# Patient Record
Sex: Male | Born: 1960 | Race: White | Hispanic: No | Marital: Married | Smoking: Never smoker
Health system: Southern US, Community
[De-identification: ages and names within clinical notes are randomized; demographics above are authoritative.]

## PROBLEM LIST (undated history)

## (undated) DIAGNOSIS — M419 Scoliosis, unspecified: Secondary | ICD-10-CM

## (undated) DIAGNOSIS — M549 Dorsalgia, unspecified: Secondary | ICD-10-CM

## (undated) DIAGNOSIS — M4800 Spinal stenosis, site unspecified: Secondary | ICD-10-CM

## (undated) DIAGNOSIS — I10 Essential (primary) hypertension: Secondary | ICD-10-CM

## (undated) DIAGNOSIS — Z8739 Personal history of other diseases of the musculoskeletal system and connective tissue: Secondary | ICD-10-CM

## (undated) HISTORY — PX: SPINAL FUSION: SHX223

---

## 2014-07-27 DEATH — deceased

## 2018-04-18 ENCOUNTER — Emergency Department (HOSPITAL_COMMUNITY)
Admission: EM | Admit: 2018-04-18 | Discharge: 2018-04-19 | Disposition: A | Discharge status: Home / Self Care | Payer: Managed Care, Other (non HMO) | Attending: Emergency Medicine | Admitting: Emergency Medicine

## 2018-04-18 ENCOUNTER — Encounter (HOSPITAL_COMMUNITY): Payer: Self-pay

## 2018-04-18 ENCOUNTER — Other Ambulatory Visit: Payer: Self-pay

## 2018-04-18 ENCOUNTER — Emergency Department (HOSPITAL_COMMUNITY): Payer: Managed Care, Other (non HMO)

## 2018-04-18 DIAGNOSIS — Y9352 Activity, horseback riding: Secondary | ICD-10-CM | POA: Diagnosis not present

## 2018-04-18 DIAGNOSIS — Z79899 Other long term (current) drug therapy: Secondary | ICD-10-CM | POA: Insufficient documentation

## 2018-04-18 DIAGNOSIS — Y929 Unspecified place or not applicable: Secondary | ICD-10-CM | POA: Insufficient documentation

## 2018-04-18 DIAGNOSIS — Y999 Unspecified external cause status: Secondary | ICD-10-CM | POA: Insufficient documentation

## 2018-04-18 DIAGNOSIS — S2241XA Multiple fractures of ribs, right side, initial encounter for closed fracture: Secondary | ICD-10-CM

## 2018-04-18 DIAGNOSIS — I10 Essential (primary) hypertension: Secondary | ICD-10-CM | POA: Diagnosis not present

## 2018-04-18 DIAGNOSIS — T1490XA Injury, unspecified, initial encounter: Secondary | ICD-10-CM

## 2018-04-18 DIAGNOSIS — F17220 Nicotine dependence, chewing tobacco, uncomplicated: Secondary | ICD-10-CM | POA: Insufficient documentation

## 2018-04-18 DIAGNOSIS — S098XXA Other specified injuries of head, initial encounter: Secondary | ICD-10-CM | POA: Diagnosis present

## 2018-04-18 HISTORY — DX: Dorsalgia, unspecified: M54.9

## 2018-04-18 HISTORY — DX: Essential (primary) hypertension: I10

## 2018-04-18 HISTORY — DX: Spinal stenosis, site unspecified: M48.00

## 2018-04-18 HISTORY — DX: Scoliosis, unspecified: M41.9

## 2018-04-18 HISTORY — DX: Personal history of other diseases of the musculoskeletal system and connective tissue: Z87.39

## 2018-04-18 LAB — I-STAT CHEM 8, ED
BUN: 15 mg/dL (ref 6–20)
CALCIUM ION: 1.12 mmol/L — AB (ref 1.15–1.40)
CHLORIDE: 107 mmol/L (ref 101–111)
CREATININE: 0.8 mg/dL (ref 0.61–1.24)
GLUCOSE: 86 mg/dL (ref 65–99)
HCT: 33 % — ABNORMAL LOW (ref 39.0–52.0)
Hemoglobin: 11.2 g/dL — ABNORMAL LOW (ref 13.0–17.0)
POTASSIUM: 3.6 mmol/L (ref 3.5–5.1)
Sodium: 142 mmol/L (ref 135–145)
TCO2: 23 mmol/L (ref 22–32)

## 2018-04-18 MED ORDER — HYDROMORPHONE HCL 2 MG/ML IJ SOLN
1.0000 mg | Freq: Once | INTRAMUSCULAR | Status: AC
  Administered 2018-04-18: 1 mg via INTRAVENOUS
  Filled 2018-04-18: qty 1

## 2018-04-18 MED ORDER — IOPAMIDOL (ISOVUE-300) INJECTION 61%
100.0000 mL | Freq: Once | INTRAVENOUS | Status: AC | PRN
  Administered 2018-04-18: 100 mL via INTRAVENOUS

## 2018-04-18 MED ORDER — IOPAMIDOL (ISOVUE-300) INJECTION 61%
INTRAVENOUS | Status: AC
  Filled 2018-04-18: qty 100

## 2018-04-18 NOTE — ED Triage Notes (Signed)
Pt BIB [facility], pt was bucked off a horse with +LOC for about a minute. Pt a.o upon ems arrival. Pt endorses right shoulder blade pain and right sided chest pain. VSS, c collar in place. 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

## 2018-04-18 NOTE — ED Notes (Signed)
Pt. Alert and resting with family@ bedside. Vital signs stable.

## 2018-04-18 NOTE — ED Provider Notes (Signed)
[facility]   CSN: [id number] Arrival date & time: 04/18/18  2120     History   Chief Complaint Chief Complaint  Patient presents with  . Fall    off horse    HPI Austin CirriJoel Pugh is a 57 y.o. male with a history of DDD, spinal stenosis s/p L4-L5 fusion, HTN, right knee arthritis s/p arthroscopy who presents to the emergency department with a chief complaint of buck from a horse.  The patient reports that he was horseback riding when the horse bucked causing him to fly through the air initially hit the ground on his right shoulder.  He bounced off the ground and ultimately landed in the prone position.  He hit his head one the ground. His wife, who is an emergency department nurse, reports LOC for 7 minutes. No nausea or emesis. He has a mild global headache in the ED. No numbness, weakness, slurred speech, facial drooping, or visual changes in the ED. Patient was alert and oriented when EMS arrived.  He was placed in a c-collar by EMS.  No numbness or weakness. He does not take any blood thinners.  In the ED, he endorses right shoulder blade pain and non-radiating right-sided chest pain. No alleviating factors. Pain is worse with taking a deep breath and direct palpation. He denies dyspnea, respiratory distress, upper or lower extremity pain, low back pain, bilateral hip pain, abdominal pain, or left-sided chest pain, no other treatment prior to arrival.  Tetanus was updated within the last 5 years.  The history is provided by the patient. No language interpreter was used.    Past Medical History:  Diagnosis Date  . Back pain   . Hx of degenerative disc disease   . Hypertension   . Scoliosis   . Spinal stenosis     There are no active problems to display for this patient.   Past Surgical History:  Procedure Laterality Date  . SPINAL FUSION     L4-L5        Home Medications    Prior to Admission medications   Medication  Sig Start Date End Date Taking? Authorizing Provider  docusate sodium (COLACE) 100 MG capsule Take 100 mg by mouth daily as needed for mild constipation.   Yes [provider]  etodolac (LODINE) 400 MG tablet Take 400 mg by mouth 2 (two) times daily. 04/06/18  Yes [provider]  gabapentin (NEURONTIN) 300 MG capsule Take 300 mg by mouth 3 (three) times daily. 04/06/18  Yes [provider]  glucosamine-chondroitin 500-400 MG tablet Take 1 tablet by mouth 2 (two) times daily.   Yes [provider]  lisinopril-hydrochlorothiazide (PRINZIDE,ZESTORETIC) 20-12.5 MG tablet Take 1 tablet by mouth daily. 03/20/18  Yes [provider]  morphine (MS CONTIN) 15 MG 12 hr tablet Take 15 mg by mouth every 12 (twelve) hours. 04/06/18  Yes [provider]  Multiple Vitamin (MULTIVITAMIN WITH MINERALS) TABS tablet Take 1 tablet by mouth daily.   Yes [provider]  Saw Palmetto 450 MG CAPS Take 450 mg by mouth daily.   Yes [provider]  sildenafil (REVATIO) 20 MG tablet Take 20 mg by mouth as needed.   Yes [provider]  HYDROcodone-acetaminophen (NORCO/VICODIN) 5-325 MG tablet Take 1 tablet by mouth every 4 (four) hours as needed for up to 3 days. 04/19/18 04/22/18  Kyleen Villatoro, Pedro EarlsMia A, PA-C    Family History No family history on file.  Social  History Social History   Tobacco Use  . Smoking status: Never Smoker  . Smokeless tobacco: Current User  Substance Use Topics  . Alcohol use: Yes  . Drug use: Not on file     Allergies   Patient has no known allergies.   Review of Systems Review of Systems  Constitutional: Negative for appetite change and fever.  Respiratory: Negative for shortness of breath.   Cardiovascular: Negative for chest pain.  Gastrointestinal: Negative for abdominal pain.  Genitourinary: Negative for dysuria.  Musculoskeletal: Positive for arthralgias, back pain, myalgias and neck pain. Negative for  neck stiffness.  Skin: Negative for color change, pallor and rash.  Allergic/Immunologic: Negative for immunocompromised state.  Neurological: Positive for syncope. Negative for weakness, numbness and headaches.  Psychiatric/Behavioral: Negative for confusion.   Physical Exam Updated Vital Signs BP (!) 147/96   Pulse 67   Temp 98.9 F (37.2 C) (Oral)   Resp 15   Ht 5\' 10"  (1.778 m)   Wt 97.5 kg (215 lb)   SpO2 99%   BMI 30.85 kg/m   Physical Exam  Constitutional: He is oriented to person, place, and time. He appears well-developed and well-nourished. No distress.  HENT:  Head: Normocephalic and atraumatic.  Nose: Nose normal.  Mouth/Throat: Uvula is midline, oropharynx is clear and moist and mucous membranes are normal.  Eyes: Pupils are equal, round, and reactive to light. Conjunctivae and EOM are normal.  Neck: Neck supple. No spinous process tenderness and no muscular tenderness present. No neck rigidity. No tracheal deviation and normal range of motion present.  Cervical collar is in place Moderate midline cervical tenderness No crepitus, deformity or step-offs Moderately tender to palpation to bilateral paraspinal muscles  Cardiovascular: Normal rate, regular rhythm, normal heart sounds and intact distal pulses. Exam reveals no gallop and no friction rub.  No murmur heard. Pulses:      Radial pulses are 2+ on the right side, and 2+ on the left side.       Dorsalis pedis pulses are 2+ on the right side, and 2+ on the left side.       Posterior tibial pulses are 2+ on the right side, and 2+ on the left side.  Pulmonary/Chest: Effort normal and breath sounds normal. No accessory muscle usage. No respiratory distress. He has no decreased breath sounds. He has no wheezes. He has no rhonchi. He has no rales. He exhibits no tenderness and no bony tenderness.  Tender to palpation to the right lateral chest wall.  No left-sided chest wall tenderness. No flail segment, crepitus or  deformity Equal chest expansion Lung are clear and breath sounds are symmetric  Abdominal: Soft. Normal appearance and bowel sounds are normal. He exhibits no distension. There is no tenderness. There is no rigidity, no guarding and no CVA tenderness.  Protuberant abdomen Abd soft and nontender  Musculoskeletal: Normal range of motion. He exhibits tenderness. He exhibits no edema or deformity.       Thoracic back: He exhibits normal range of motion.       Lumbar back: He exhibits normal range of motion.  Head to toe exam Full range of motion of the T-spine and L-spine Diffusely tender to palpation of the spinous processes of the T-spine or L-spine No crepitus, deformity or step-offs Mild tenderness to palpation of the paraspinous muscles of the L-spine Tender to palpation to the inferior border of the right scapula. No focal tenderness to the bilateral shoulders, upper arms, elbows, forearms, wrists,  hands, hips, pelvis, thighs, knees, lower extremities, ankles, feet.  Lymphadenopathy:    He has no cervical adenopathy.  Neurological: He is alert and oriented to person, place, and time. No cranial nerve deficit. GCS eye subscore is 4. GCS verbal subscore is 5. GCS motor subscore is 6.  Speech is clear and goal oriented, follows commands Normal 5/5 strength in upper and lower extremities bilaterally including dorsiflexion and plantar flexion, strong and equal grip strength Sensation intact bilaterally to light and sharp touch; mildly decreased on left compared to right, which the patient reports is baseline. Moves extremities without ataxia, coordination intact Normal gait and balance   Skin: Skin is warm and dry. No rash noted. He is not diaphoretic. No erythema.  Psychiatric: He has a normal mood and affect. His behavior is normal.  Nursing note and vitals reviewed.    ED Treatments / Results  Labs (all labs ordered are listed, but only abnormal results are displayed) Labs Reviewed    I-STAT CHEM 8, ED - Abnormal; Notable for the following components:      Result Value   Calcium, Ion 1.12 (*)    Hemoglobin 11.2 (*)    HCT 33.0 (*)    All other components within normal limits    EKG None  Radiology Ct Head Wo Contrast  Result Date: 04/18/2018 CLINICAL DATA:  Patient was bucked off a horse with positive loss of consciousness for a minute. EXAM: CT HEAD WITHOUT CONTRAST CT CERVICAL SPINE WITHOUT CONTRAST TECHNIQUE: Multidetector CT imaging of the head and cervical spine was performed following the standard protocol without intravenous contrast. Multiplanar CT image reconstructions of the cervical spine were also generated. COMPARISON:  None. FINDINGS: CT HEAD FINDINGS BRAIN: The ventricles and sulci are normal. No intraparenchymal hemorrhage, mass effect nor midline shift. No acute large vascular territory infarcts. No abnormal extra-axial fluid collections. Basal cisterns are midline and not effaced. No acute cerebellar abnormality. VASCULAR: No hyperdense vessel or unexpected calcification. SKULL/SOFT TISSUES: No skull fracture. No significant soft tissue swelling. ORBITS/SINUSES: The included ocular globes and orbital contents are normal.The mastoid air-cells and included paranasal sinuses are well-aerated. OTHER: None. CT CERVICAL SPINE FINDINGS ALIGNMENT: Vertebral bodies in alignment. Maintained lordosis. SKULL BASE AND VERTEBRAE: Cervical vertebral bodies and posterior elements are intact. Intervertebral disc heights preserved. No destructive bony lesions. C1-2 articulation maintained. SOFT TISSUES AND SPINAL CANAL: Normal. DISC LEVELS: No significant osseous canal stenosis or neural foraminal narrowing. Marked degenerative disc disease and disc flattening at C5-6 and C6-7 with small posterior marginal osteophytes and uncovertebral joint osteoarthritis. There is slight left-sided foraminal encroachment from uncinate spurring at C5-6 and C6-7. UPPER CHEST: No acute abnormality.  OTHER: None. IMPRESSION: 1. No acute intracranial abnormality or skull fracture. 2. Cervical spondylosis with degenerative disc disease at C5-6 and C6-7 as above. No acute cervical spine fracture or posttraumatic listhesis. Electronically Signed   By: Tollie Eth M.D.   On: 04/18/2018 23:41   Ct Chest W Contrast  Result Date: 04/18/2018 CLINICAL DATA:  Bucked off of horse, right shoulder blade and chest pain EXAM: CT CHEST, ABDOMEN, AND PELVIS WITH CONTRAST TECHNIQUE: Multidetector CT imaging of the chest, abdomen and pelvis was performed following the standard protocol during bolus administration of intravenous contrast. CONTRAST:  ISOVUE-300 IOPAMIDOL (ISOVUE-300) INJECTION 61% COMPARISON:  None. FINDINGS: CT CHEST FINDINGS Cardiovascular: Nonaneurysmal aorta. Minimal coronary vascular calcification. Heart size slightly enlarged. No pericardial effusion Mediastinum/Nodes: Negative for mediastinal hematoma. Midline trachea. No thyroid mass. Mildly prominent lymph node  in the prevascular space measuring 9 mm. Small distal esophageal hiatal hernia Lungs/Pleura: No pneumothorax, pleural effusion or focal opacity. Mild dependent atelectasis Musculoskeletal: Multiple old left greater than right rib fractures. Sternum is intact. Acute right eighth and eleventh lateral rib fractures. CT ABDOMEN PELVIS FINDINGS Hepatobiliary: No hepatic injury or perihepatic hematoma. Gallbladder is unremarkable Pancreas: Unremarkable. No pancreatic ductal dilatation or surrounding inflammatory changes. Spleen: No splenic injury or perisplenic hematoma. Adrenals/Urinary Tract: No adrenal hemorrhage or renal injury identified. Bladder is unremarkable. Stomach/Bowel: Stomach is within normal limits. Appendix not well seen but no right lower quadrant inflammation. No evidence of bowel wall thickening, distention, or inflammatory changes. Vascular/Lymphatic: Mild aortic atherosclerosis. No aneurysm. No significant adenopathy.  Reproductive: Enlarged prostate Other: Negative for free air or free fluid. Musculoskeletal: No fracture or malalignment. Posterior stabilization rods and fixating screws at L4-L5 with interbody device. IMPRESSION: 1. No CT evidence for acute mediastinal or thoracic injury. 2. No CT evidence for acute intra-abdominal or pelvic abnormality 3. Acute right eighth and eleventh rib fractures. Electronically Signed   By: Jasmine Pang M.D.   On: 04/18/2018 23:56   Ct Cervical Spine Wo Contrast  Result Date: 04/18/2018 CLINICAL DATA:  Patient was bucked off a horse with positive loss of consciousness for a minute. EXAM: CT HEAD WITHOUT CONTRAST CT CERVICAL SPINE WITHOUT CONTRAST TECHNIQUE: Multidetector CT imaging of the head and cervical spine was performed following the standard protocol without intravenous contrast. Multiplanar CT image reconstructions of the cervical spine were also generated. COMPARISON:  None. FINDINGS: CT HEAD FINDINGS BRAIN: The ventricles and sulci are normal. No intraparenchymal hemorrhage, mass effect nor midline shift. No acute large vascular territory infarcts. No abnormal extra-axial fluid collections. Basal cisterns are midline and not effaced. No acute cerebellar abnormality. VASCULAR: No hyperdense vessel or unexpected calcification. SKULL/SOFT TISSUES: No skull fracture. No significant soft tissue swelling. ORBITS/SINUSES: The included ocular globes and orbital contents are normal.The mastoid air-cells and included paranasal sinuses are well-aerated. OTHER: None. CT CERVICAL SPINE FINDINGS ALIGNMENT: Vertebral bodies in alignment. Maintained lordosis. SKULL BASE AND VERTEBRAE: Cervical vertebral bodies and posterior elements are intact. Intervertebral disc heights preserved. No destructive bony lesions. C1-2 articulation maintained. SOFT TISSUES AND SPINAL CANAL: Normal. DISC LEVELS: No significant osseous canal stenosis or neural foraminal narrowing. Marked degenerative disc disease  and disc flattening at C5-6 and C6-7 with small posterior marginal osteophytes and uncovertebral joint osteoarthritis. There is slight left-sided foraminal encroachment from uncinate spurring at C5-6 and C6-7. UPPER CHEST: No acute abnormality. OTHER: None. IMPRESSION: 1. No acute intracranial abnormality or skull fracture. 2. Cervical spondylosis with degenerative disc disease at C5-6 and C6-7 as above. No acute cervical spine fracture or posttraumatic listhesis. Electronically Signed   By: Tollie Eth M.D.   On: 04/18/2018 23:41   Ct Abdomen Pelvis W Contrast  Result Date: 04/18/2018 CLINICAL DATA:  Bucked off of horse, right shoulder blade and chest pain EXAM: CT CHEST, ABDOMEN, AND PELVIS WITH CONTRAST TECHNIQUE: Multidetector CT imaging of the chest, abdomen and pelvis was performed following the standard protocol during bolus administration of intravenous contrast. CONTRAST:  ISOVUE-300 IOPAMIDOL (ISOVUE-300) INJECTION 61% COMPARISON:  None. FINDINGS: CT CHEST FINDINGS Cardiovascular: Nonaneurysmal aorta. Minimal coronary vascular calcification. Heart size slightly enlarged. No pericardial effusion Mediastinum/Nodes: Negative for mediastinal hematoma. Midline trachea. No thyroid mass. Mildly prominent lymph node in the prevascular space measuring 9 mm. Small distal esophageal hiatal hernia Lungs/Pleura: No pneumothorax, pleural effusion or focal opacity. Mild dependent atelectasis Musculoskeletal: Multiple old  left greater than right rib fractures. Sternum is intact. Acute right eighth and eleventh lateral rib fractures. CT ABDOMEN PELVIS FINDINGS Hepatobiliary: No hepatic injury or perihepatic hematoma. Gallbladder is unremarkable Pancreas: Unremarkable. No pancreatic ductal dilatation or surrounding inflammatory changes. Spleen: No splenic injury or perisplenic hematoma. Adrenals/Urinary Tract: No adrenal hemorrhage or renal injury identified. Bladder is unremarkable. Stomach/Bowel: Stomach is  within normal limits. Appendix not well seen but no right lower quadrant inflammation. No evidence of bowel wall thickening, distention, or inflammatory changes. Vascular/Lymphatic: Mild aortic atherosclerosis. No aneurysm. No significant adenopathy. Reproductive: Enlarged prostate Other: Negative for free air or free fluid. Musculoskeletal: No fracture or malalignment. Posterior stabilization rods and fixating screws at L4-L5 with interbody device. IMPRESSION: 1. No CT evidence for acute mediastinal or thoracic injury. 2. No CT evidence for acute intra-abdominal or pelvic abnormality 3. Acute right eighth and eleventh rib fractures. Electronically Signed   By: Jasmine Pang M.D.   On: 04/18/2018 23:56   Ct T-spine No Charge  Result Date: 04/18/2018 CLINICAL DATA:  Bucked from horse.  RIGHT chest pain. EXAM: CT THORACIC AND LUMBAR SPINE WITHOUT CONTRAST TECHNIQUE: Multidetector CT imaging of the thoracic and lumbar spine was performed without contrast. Multiplanar CT image reconstructions were also generated. COMPARISON:  None. FINDINGS: CT THORACIC SPINE FINDINGS ALIGNMENT: Maintained thoracic lordosis. No malalignment. VERTEBRAE: Vertebral bodies and posterior elements are intact. Multilevel lower thoracic severe disc height loss with vacuum disc, endplate sclerosis and marginal spurring. Scattered Schmorl's nodes without destructive bony lesions. PARASPINAL AND OTHER SOFT TISSUES: Please see CT of chest from same day, reported separately for dedicated findings. DISC LEVELS: No osseous canal stenosis. Moderate lower thoracic neural foraminal narrowing. CT LUMBAR SPINE FINDINGS SEGMENTATION: For the purposes of this report the last well-formed intervertebral disc space is reported as L5-S1. ALIGNMENT: Grade 1 L4-5 anterolisthesis.  Broad levoscoliosis. VERTEBRAE: Status post L4-5 PLIF with arthrodesis. Intact well-seated hardware. Suspected L5 laminectomies. Lumbar vertebral bodies intact. Multilevel moderate  to severe disc height loss with vacuum disc, endplate sclerosis and marginal spurring consistent with degenerative discs. PARASPINAL AND OTHER SOFT TISSUES: Please see CT of abdomen and pelvis from same day, reported separately for dedicated findings. DISC LEVELS: Moderate canal stenosis L2-3, moderate canal stenosis L3-4. Severe RIGHT L2-3, L3-4, L4-5 and LEFT L3-4 and LEFT L5-S1 neural foraminal narrowing. IMPRESSION: CT THORACIC SPINE IMPRESSION 1. No fracture or malalignment. 2. Advanced spondylosis. CT LUMBAR SPINE IMPRESSION 1. No fracture. Grade 1 L4-5 anterolisthesis; status post L4-5 PLIF with arthrodesis. 2. Moderate canal stenosis L2-3 and L3-4. Severe neural foraminal narrowing L2-3 through L5-S1. Electronically Signed   By: Awilda Metro M.D.   On: 04/18/2018 23:38   Ct L-spine No Charge  Result Date: 04/18/2018 CLINICAL DATA:  Bucked from horse.  RIGHT chest pain. EXAM: CT THORACIC AND LUMBAR SPINE WITHOUT CONTRAST TECHNIQUE: Multidetector CT imaging of the thoracic and lumbar spine was performed without contrast. Multiplanar CT image reconstructions were also generated. COMPARISON:  None. FINDINGS: CT THORACIC SPINE FINDINGS ALIGNMENT: Maintained thoracic lordosis. No malalignment. VERTEBRAE: Vertebral bodies and posterior elements are intact. Multilevel lower thoracic severe disc height loss with vacuum disc, endplate sclerosis and marginal spurring. Scattered Schmorl's nodes without destructive bony lesions. PARASPINAL AND OTHER SOFT TISSUES: Please see CT of chest from same day, reported separately for dedicated findings. DISC LEVELS: No osseous canal stenosis. Moderate lower thoracic neural foraminal narrowing. CT LUMBAR SPINE FINDINGS SEGMENTATION: For the purposes of this report the last well-formed intervertebral disc space is reported as  L5-S1. ALIGNMENT: Grade 1 L4-5 anterolisthesis.  Broad levoscoliosis. VERTEBRAE: Status post L4-5 PLIF with arthrodesis. Intact well-seated hardware.  Suspected L5 laminectomies. Lumbar vertebral bodies intact. Multilevel moderate to severe disc height loss with vacuum disc, endplate sclerosis and marginal spurring consistent with degenerative discs. PARASPINAL AND OTHER SOFT TISSUES: Please see CT of abdomen and pelvis from same day, reported separately for dedicated findings. DISC LEVELS: Moderate canal stenosis L2-3, moderate canal stenosis L3-4. Severe RIGHT L2-3, L3-4, L4-5 and LEFT L3-4 and LEFT L5-S1 neural foraminal narrowing. IMPRESSION: CT THORACIC SPINE IMPRESSION 1. No fracture or malalignment. 2. Advanced spondylosis. CT LUMBAR SPINE IMPRESSION 1. No fracture. Grade 1 L4-5 anterolisthesis; status post L4-5 PLIF with arthrodesis. 2. Moderate canal stenosis L2-3 and L3-4. Severe neural foraminal narrowing L2-3 through L5-S1. Electronically Signed   By: Awilda Metro M.D.   On: 04/18/2018 23:38    Procedures Procedures (including critical care time)  Medications Ordered in ED Medications  iopamidol (ISOVUE-300) 61 % injection (has no administration in time range)  HYDROmorphone (DILAUDID) injection 1 mg (1 mg Intravenous Given 04/18/18 2312)  iopamidol (ISOVUE-300) 61 % injection 100 mL (100 mLs Intravenous Contrast Given 04/18/18 2245)  HYDROmorphone (DILAUDID) injection 1 mg (1 mg Intravenous Given 04/19/18 0054)     Initial Impression / Assessment and Plan / ED Course  I have reviewed the triage vital signs and the nursing notes.  Pertinent labs & imaging results that were available during my care of the patient were reviewed by me and considered in my medical decision making (see chart for details).    57 year old male with a history of DDD, spinal stenosis s/p L4-L5 fusion, HTN, right knee arthritis s/p arthroscopy presenting to the ED by EMS after he was thrown to the ground while riding a horse.  C-collar placed by EMS.  On exam, no focal neurologic deficits.  He is tender to palpation to the cervical, thoracic, lumbar spine  diffusely, right chest wall, and inferior border of the right scapula.  Acute right eighth and 11th rib fracture seen on CT chest.  CT head is negative.  Imaging is otherwise negative for acute pathology.  Pain controlled with Dilaudid.  Ambulated successfully in the ED prior to arrival.  He was given an incentive spirometer.  He is on pain medication chronically due to his chronic back pain.  Takes MS Contin every 12 hours and 5 mg of Vicodin every 4 hours as needed for breakthrough pain.  We will increase his Vicodin to 10 mg for the next 3 days due to acute injuries.  Recommended follow-up with PCP if pain is uncontrolled.  Strict return precautions to the emergency department given.  He is hemodynamically stable and in no acute distress.  The patient is safe for discharge to home with outpatient follow-up at this time.  Final Clinical Impressions(s) / ED Diagnoses   Final diagnoses:  Animal-rider injured by fall from or being thrown from horse in noncollision accident, initial encounter  Closed fracture of multiple ribs of right side, initial encounter    ED Discharge Orders        Ordered    HYDROcodone-acetaminophen (NORCO/VICODIN) 5-325 MG tablet  Every 4 hours PRN     04/19/18 0027       Frederik Pear A, PA-C 04/19/18 0130    Long, Arlyss Repress, MD 04/19/18 1000

## 2018-04-19 MED ORDER — HYDROMORPHONE HCL 2 MG/ML IJ SOLN
1.0000 mg | Freq: Once | INTRAMUSCULAR | Status: AC
  Administered 2018-04-19: 1 mg via INTRAVENOUS
  Filled 2018-04-19: qty 1

## 2018-04-19 MED ORDER — HYDROCODONE-ACETAMINOPHEN 5-325 MG PO TABS: 1.0000 | ORAL_TABLET | ORAL | 0 refills | Status: AC | PRN

## 2018-04-19 NOTE — ED Notes (Signed)
Pt ambulated in hallway well

## 2018-04-19 NOTE — Discharge Instructions (Addendum)
Thank you for allowing the pain in your care me to care for you in the emergency department tonight.  Your CT showed right rib fractures of rib 8 and 11.   You currently take 5 mg of Vicodin (1 tablets) once every 4 hours as needed. You can increase this to 10 mg (2 tablets) once every 4-6 hours for the next 3 days.   If you pain does not improve with this regimen, please follow-up with your primary care provider.  Use the incentive spirometer at home as directed.  Using the incentive spirometer will help to prevent pneumonia.  If you develop any new or worsening symptoms including shortness of breath, chest pain, vomiting, severe abdominal pain, dizziness, vomiting, or any other new symptoms, particularly in the next few days, please return to the emergency department for re-evaluation.

## 2018-12-17 IMAGING — CT CT CHEST W/ CM
3 of 5 series · 14 of 36 positions shown, 17 images · IV contrast (iopamidol)
Comparison: None.

CLINICAL DATA: Bucked off of horse, right shoulder blade and chest
pain

EXAM:
CT CHEST, ABDOMEN, AND PELVIS WITH CONTRAST
TECHNIQUE: Multidetector CT imaging of the chest, abdomen and pelvis was
performed following the standard protocol during bolus
administration of intravenous contrast.
CONTRAST:  100mL HU56KA-UDD IOPAMIDOL (HU56KA-UDD) INJECTION 61%

[Series 3: cap with · axial · 0.76mm/px · z∈[-870,-350]mm · 9 of 132 slices shown, 12 images]
[im 14/132  mediastinal]
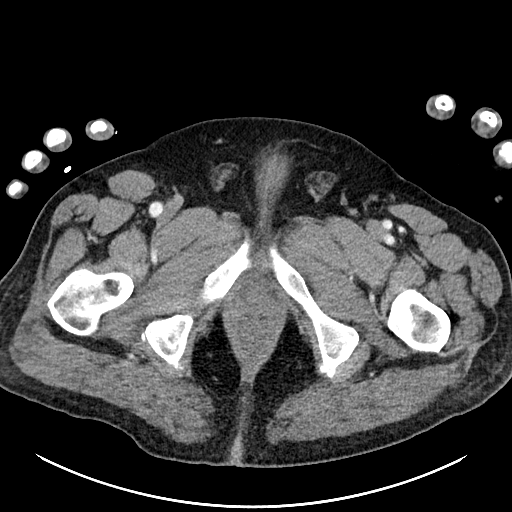
[im 14/132  lung]
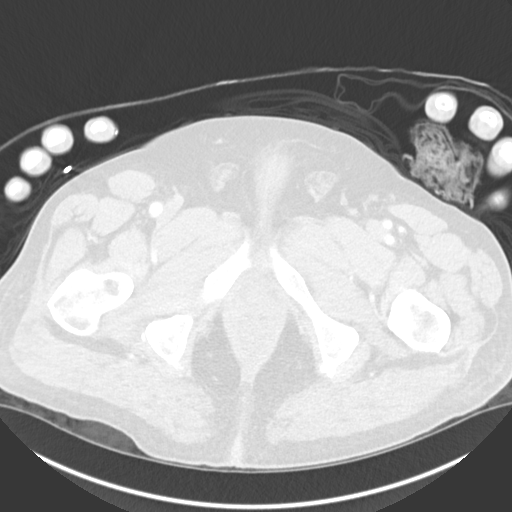
[im 27/132  lung]
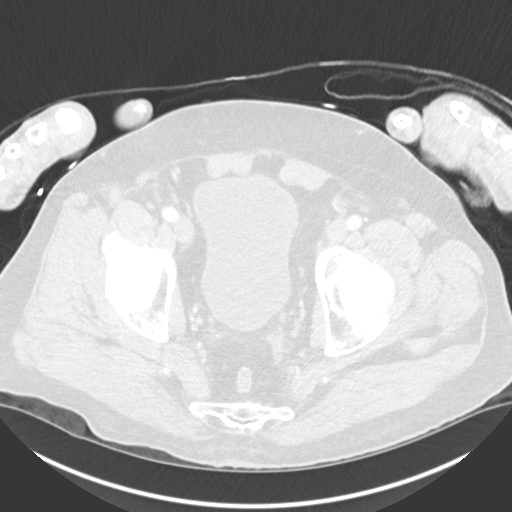
[im 40/132  lung]
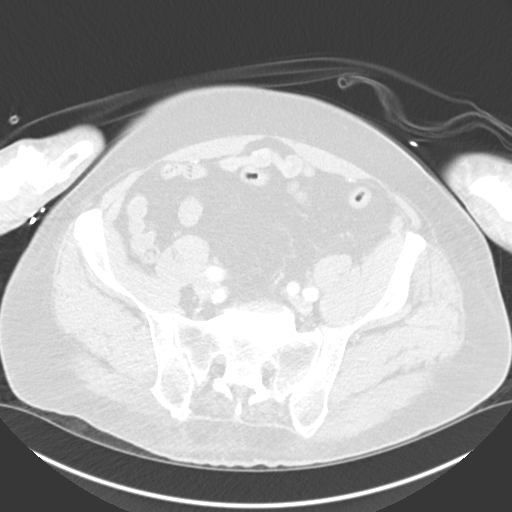
[im 53/132  lung]
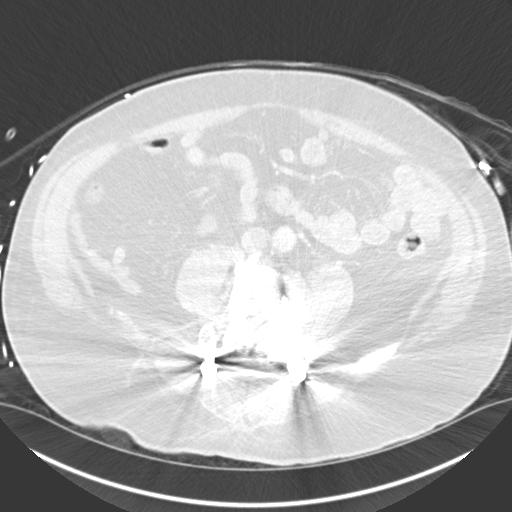
[im 66/132  mediastinal]
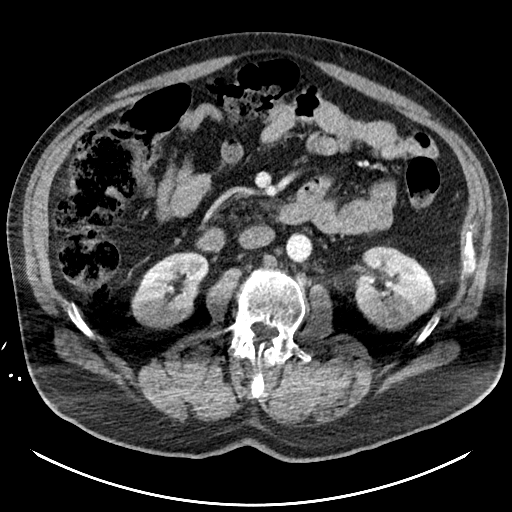
[im 66/132  lung]
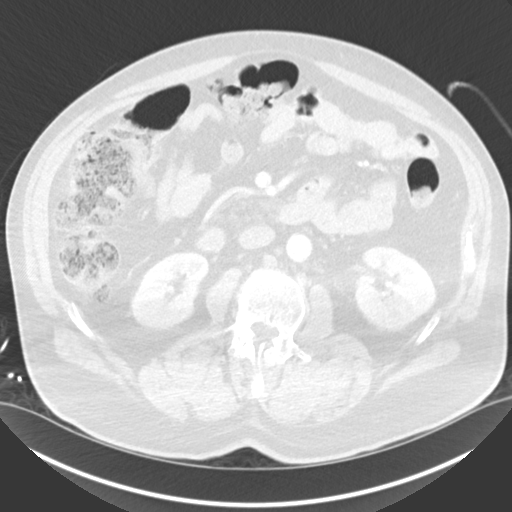
[im 79/132  lung]
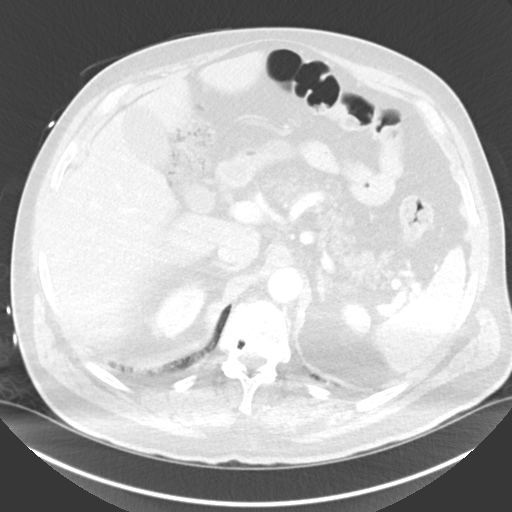
[im 92/132  lung]
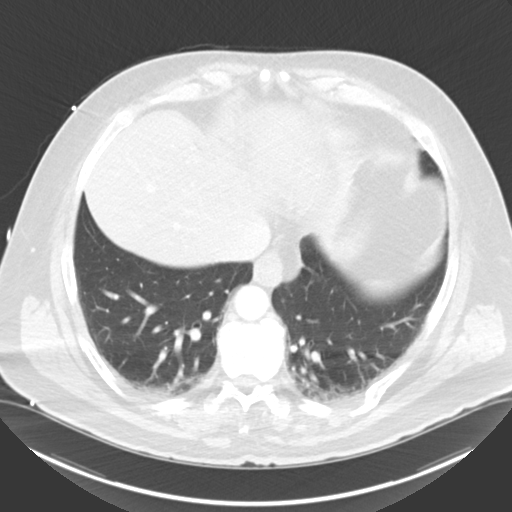
[im 105/132  lung]
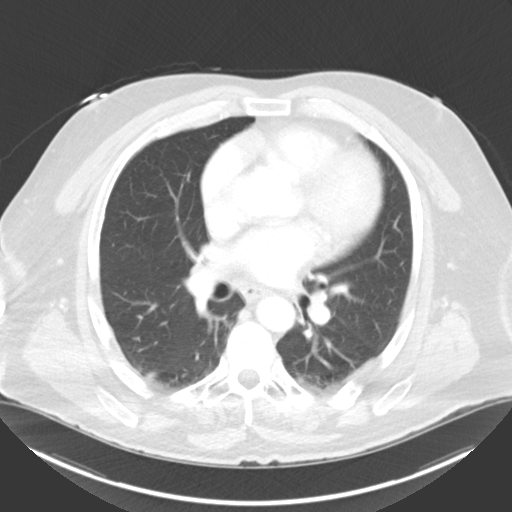
[im 118/132  mediastinal]
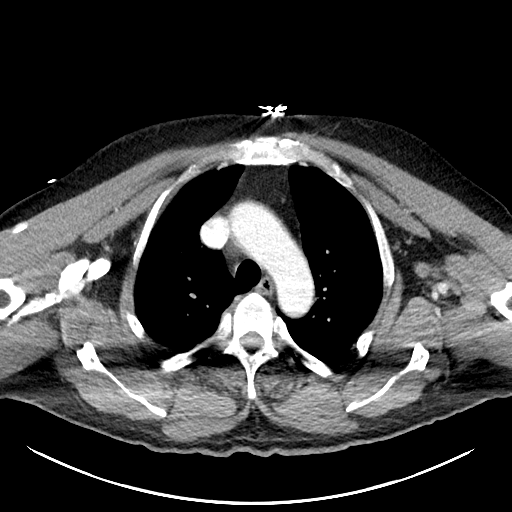
[im 118/132  lung]
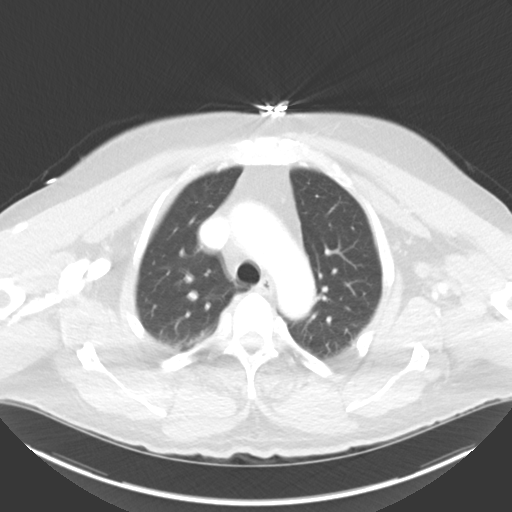

[Series 5: lungs · axial · 0.76mm/px · z∈[-562,-514]mm · 2 of 153 slices shown]
[im 12/153  lung]
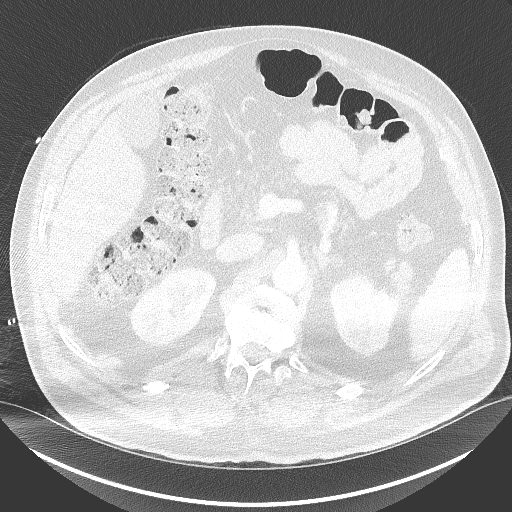
[im 36/153  lung]
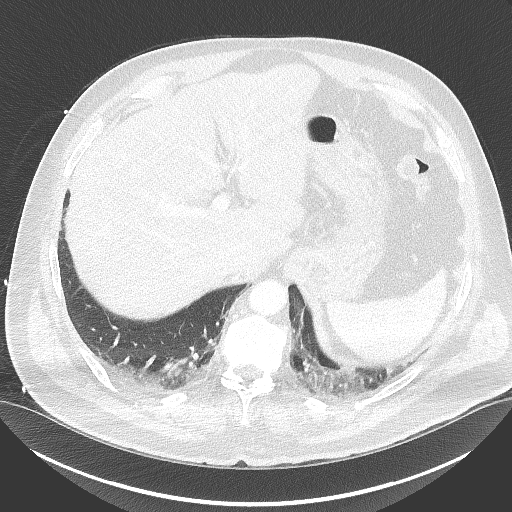

[Series 6: cor · coronal · 0.86mm/px · 3 of 116 slices shown]
[im 24/116  lung]
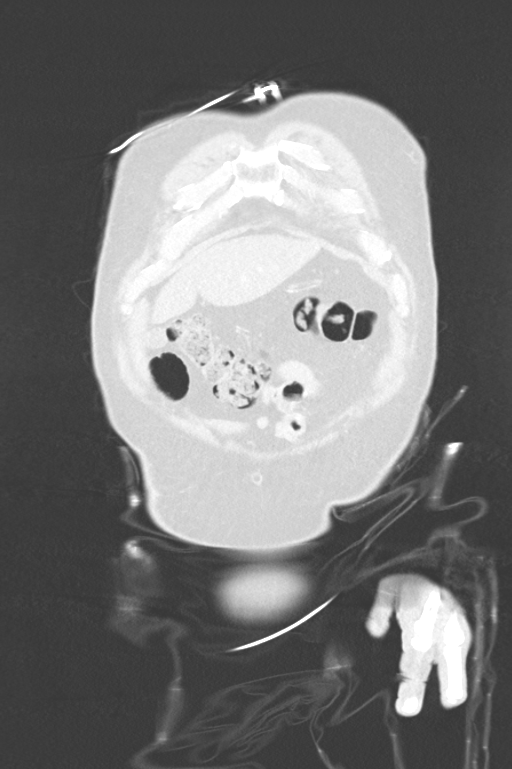
[im 47/116  lung]
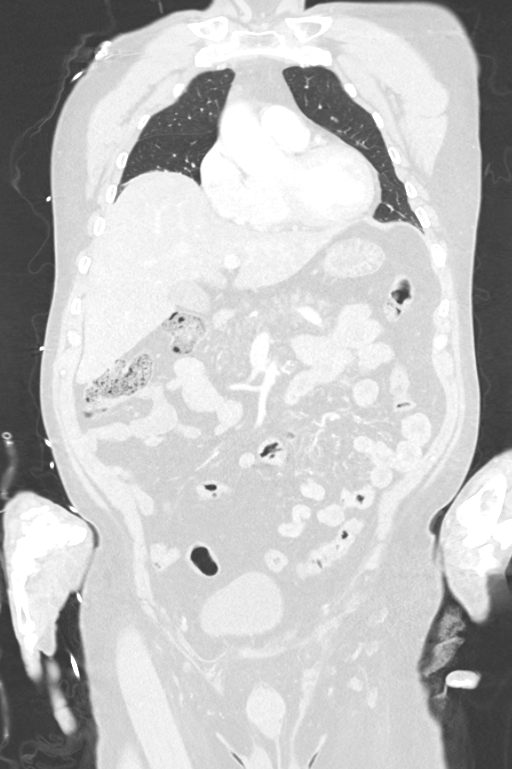
[im 70/116  lung]
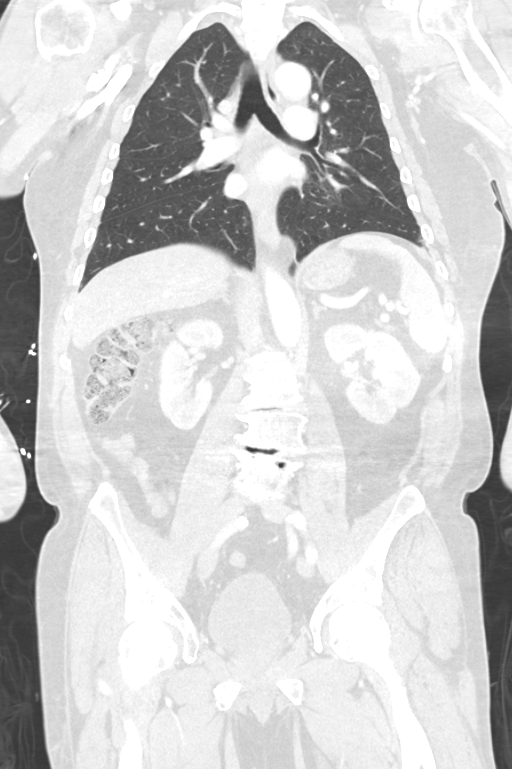

[14 of 36 positions shown; findings below may reference images not displayed]

FINDINGS: CT CHEST FINDINGS

Cardiovascular: Nonaneurysmal aorta. Minimal coronary vascular
calcification. Heart size slightly enlarged. No pericardial effusion

Mediastinum/Nodes: Negative for mediastinal hematoma. Midline
trachea. No thyroid mass. Mildly prominent lymph node in the
prevascular space measuring 9 mm. Small distal esophageal hiatal
hernia

Lungs/Pleura: No pneumothorax, pleural effusion or focal opacity.
Mild dependent atelectasis

Musculoskeletal: Multiple old left greater than right rib fractures.
Sternum is intact. Acute right eighth and eleventh lateral rib
fractures.

CT ABDOMEN PELVIS FINDINGS

Hepatobiliary: No hepatic injury or perihepatic hematoma.
Gallbladder is unremarkable

Pancreas: Unremarkable. No pancreatic ductal dilatation or
surrounding inflammatory changes.

Spleen: No splenic injury or perisplenic hematoma.

Adrenals/Urinary Tract: No adrenal hemorrhage or renal injury
identified. Bladder is unremarkable.

Stomach/Bowel: Stomach is within normal limits. Appendix not well
seen but no right lower quadrant inflammation. No evidence of bowel
wall thickening, distention, or inflammatory changes.

Vascular/Lymphatic: Mild aortic atherosclerosis. No aneurysm. No
significant adenopathy.

Reproductive: Enlarged prostate

Other: Negative for free air or free fluid.

Musculoskeletal: No fracture or malalignment. Posterior
stabilization rods and fixating screws at L4-L5 with interbody
device.
IMPRESSION: 1. No CT evidence for acute mediastinal or thoracic injury.
2. No CT evidence for acute intra-abdominal or pelvic abnormality
3. Acute right eighth and eleventh rib fractures.

## 2018-12-17 IMAGING — CT CT T SPINE W/O CM
3 series · 10 of 33 positions shown, 12 images · non-contrast
Comparison: None.

CLINICAL DATA: Bucked from horse.  RIGHT chest pain.

EXAM:
CT THORACIC AND LUMBAR SPINE WITHOUT CONTRAST
TECHNIQUE: Multidetector CT imaging of the thoracic and lumbar spine was
performed without contrast. Multiplanar CT image reconstructions
were also generated.

[Series 1: t spine axial · axial · 0.29mm/px · z∈[-500,-354]mm · 2 of 159 slices shown, 3 images]
[im 49/159  soft-tissue]
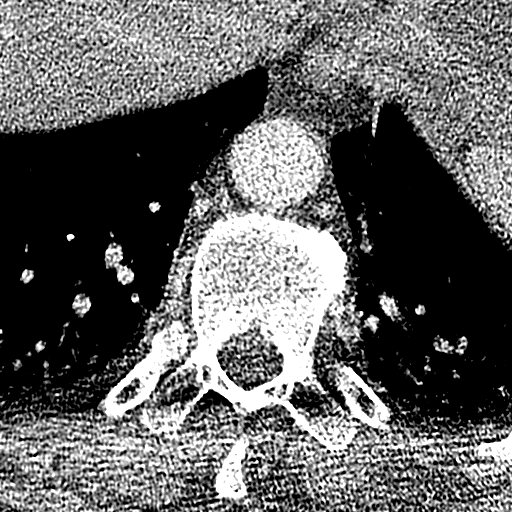
[im 49/159  bone]
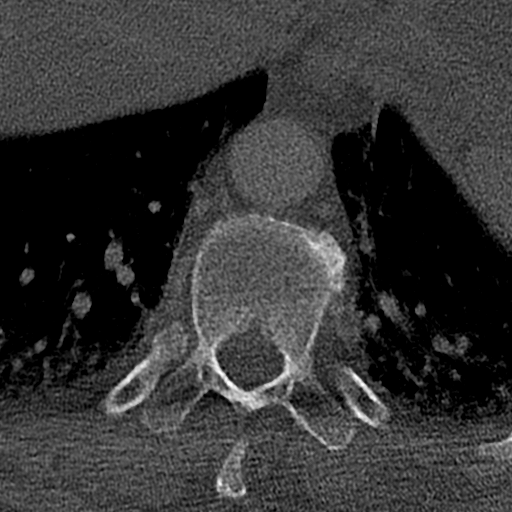
[im 122/159  bone]
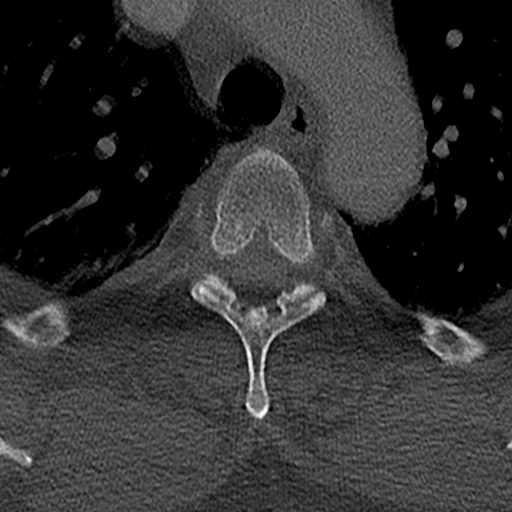

[Series 4: t spine coronal · coronal · 0.34mm/px · 3 of 75 slices shown]
[im 15/75  bone]
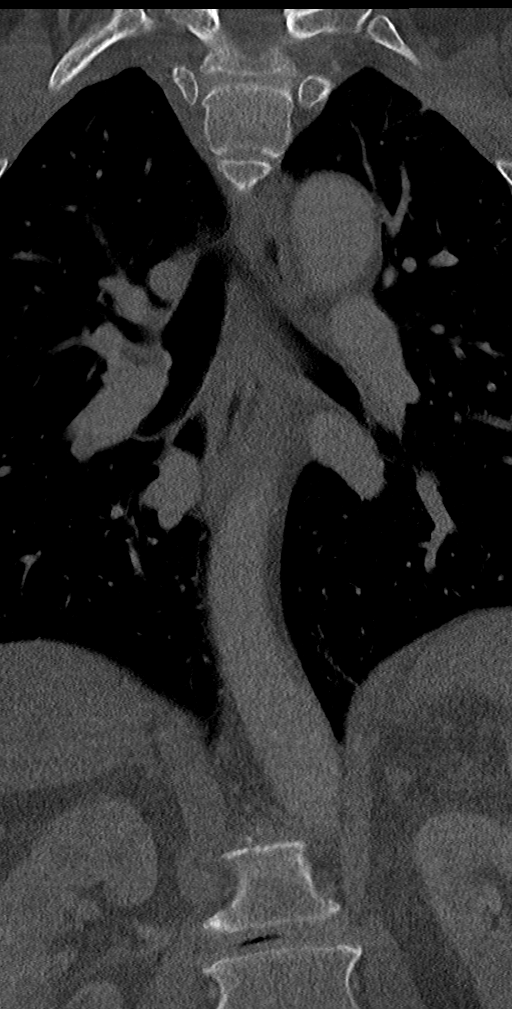
[im 30/75  bone]
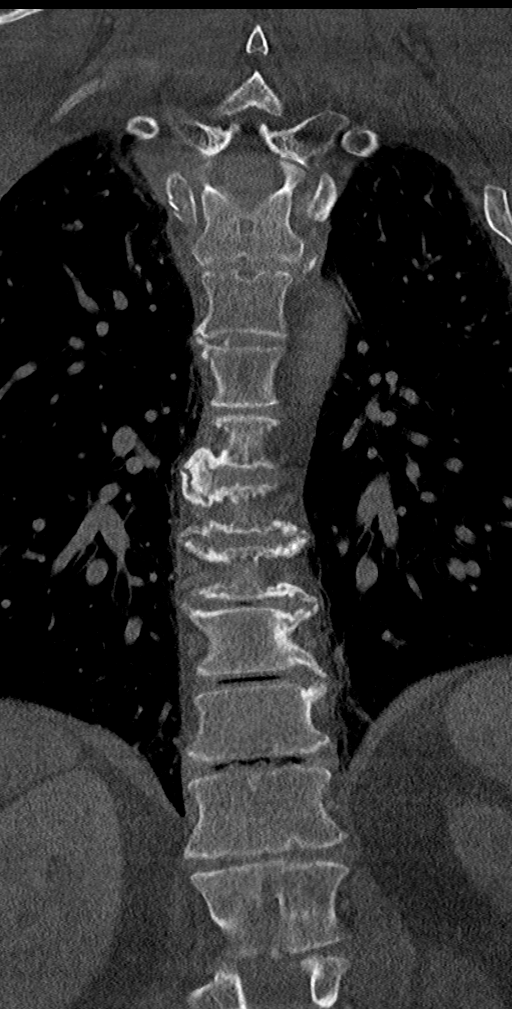
[im 45/75  bone]
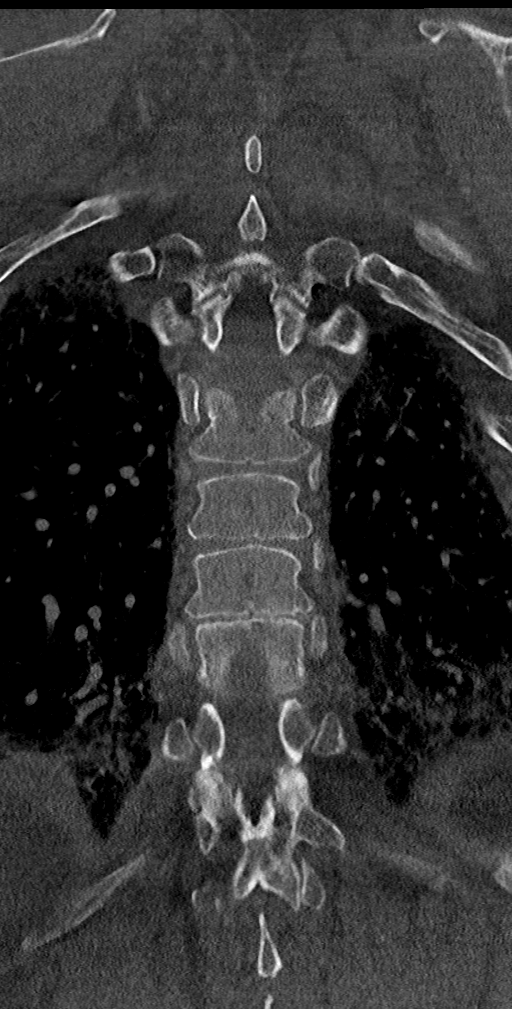

[Series 5: t spine sag · sagittal · 0.40mm/px · 5 of 73 slices shown, 6 images]
[im 25/73  bone]
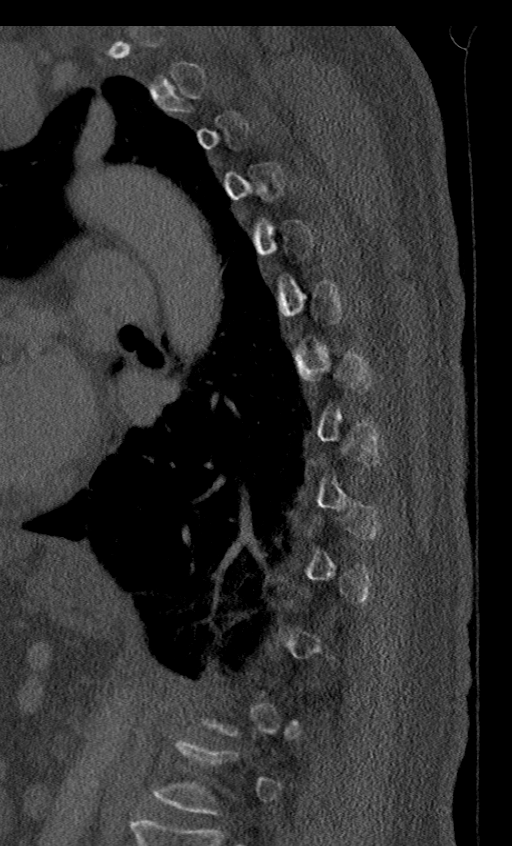
[im 31/73  bone]
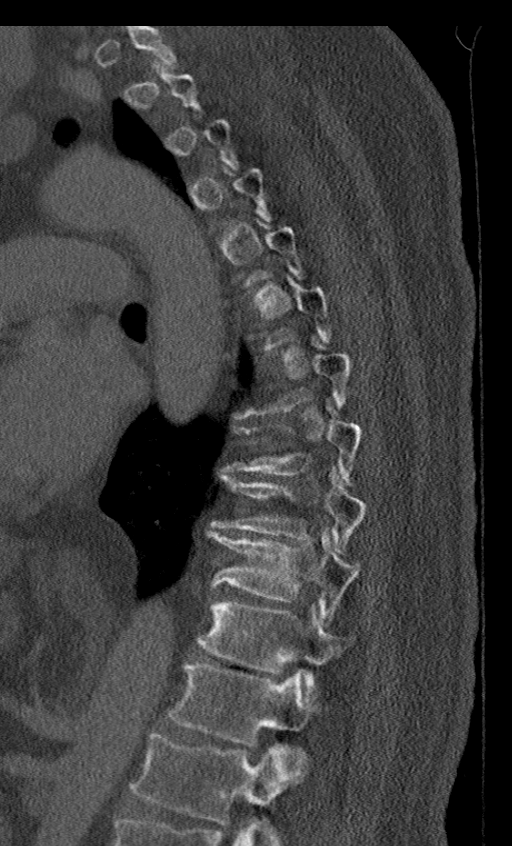
[im 37/73  soft-tissue]
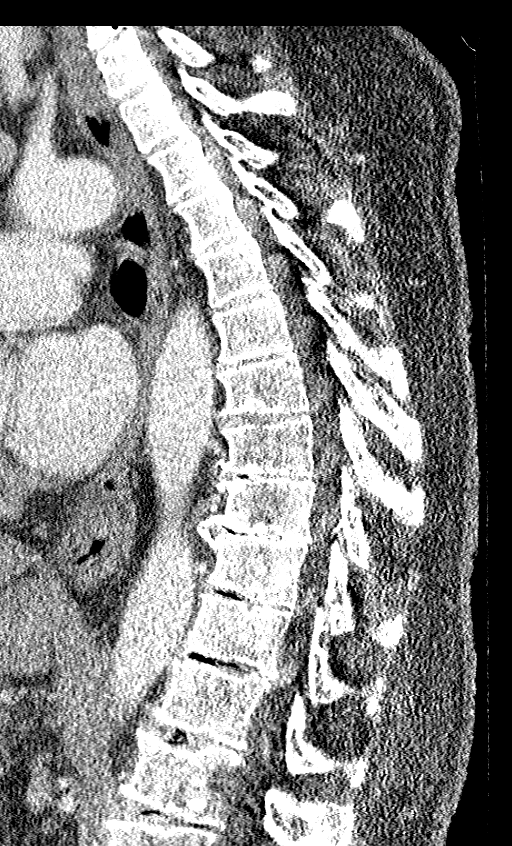
[im 37/73  bone]
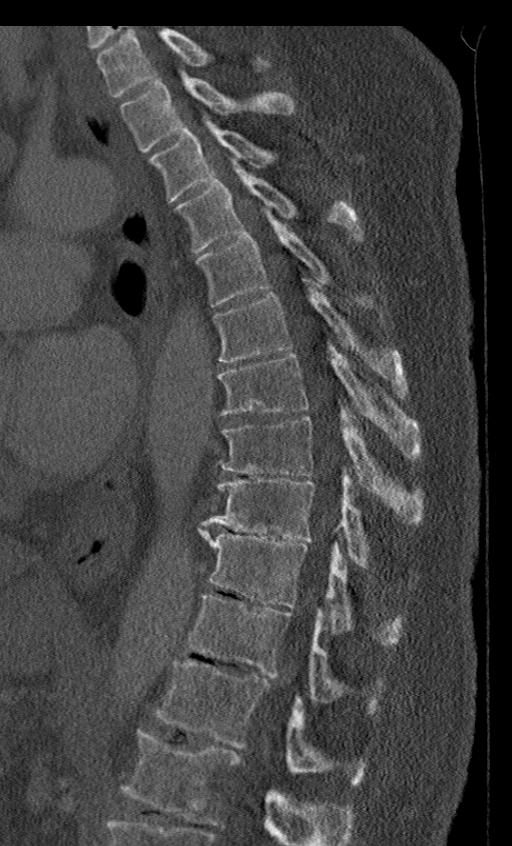
[im 43/73  bone]
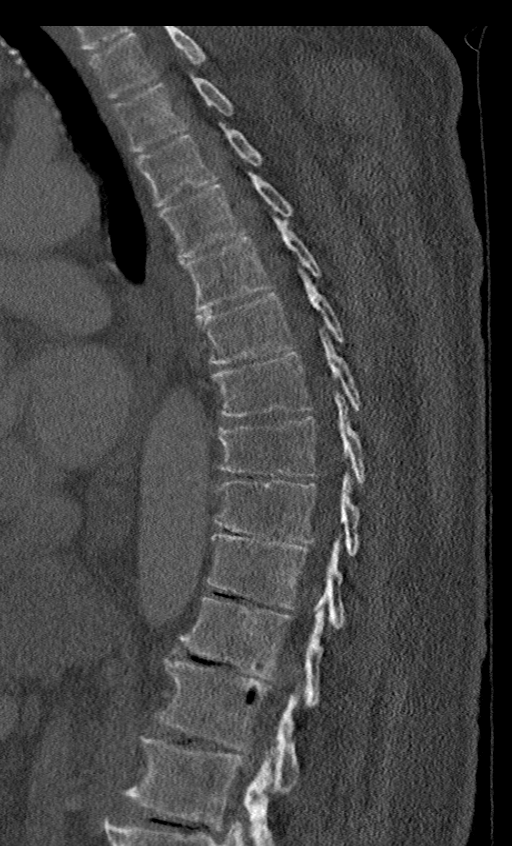
[im 49/73  bone]
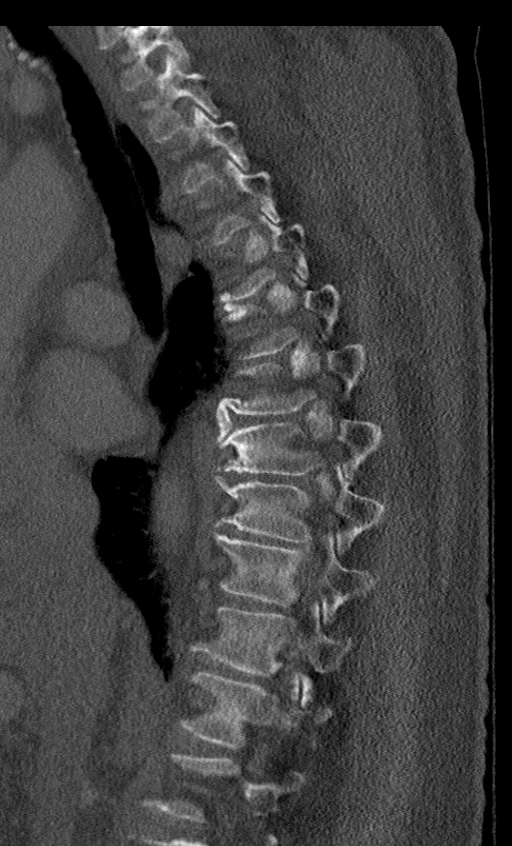

[10 of 33 positions shown; findings below may reference images not displayed]

FINDINGS: CT THORACIC SPINE FINDINGS

ALIGNMENT: Maintained thoracic lordosis. No malalignment.

VERTEBRAE: Vertebral bodies and posterior elements are intact.
Multilevel lower thoracic severe disc height loss with vacuum disc,
endplate sclerosis and marginal spurring. Scattered Schmorl's nodes
without destructive bony lesions.

PARASPINAL AND OTHER SOFT TISSUES: Please see CT of chest from same
day, reported separately for dedicated findings.

DISC LEVELS:

No osseous canal stenosis. Moderate lower thoracic neural foraminal
narrowing.

CT LUMBAR SPINE FINDINGS

SEGMENTATION: For the purposes of this report the last well-formed
intervertebral disc space is reported as L5-S1.

ALIGNMENT: Grade 1 L4-5 anterolisthesis.  Broad levoscoliosis.

VERTEBRAE: Status post L4-5 PLIF with arthrodesis. Intact
well-seated hardware. Suspected L5 laminectomies. Lumbar vertebral
bodies intact. Multilevel moderate to severe disc height loss with
vacuum disc, endplate sclerosis and marginal spurring consistent
with degenerative discs.

PARASPINAL AND OTHER SOFT TISSUES: Please see CT of abdomen and
pelvis from same day, reported separately for dedicated findings.

DISC LEVELS:

Moderate canal stenosis L2-3, moderate canal stenosis L3-4. Severe
RIGHT L2-3, L3-4, L4-5 and LEFT L3-4 and LEFT L5-S1 neural foraminal
narrowing.
IMPRESSION: CT THORACIC SPINE IMPRESSION

1. No fracture or malalignment.
2. Advanced spondylosis.

CT LUMBAR SPINE IMPRESSION

1. No fracture. Grade 1 L4-5 anterolisthesis; status post L4-5 PLIF
with arthrodesis.
2. Moderate canal stenosis L2-3 and L3-4. Severe neural foraminal
narrowing L2-3 through L5-S1.
